# Patient Record
Sex: Male | Born: 1995 | Race: White | Hispanic: No | Marital: Single | Smoking: Never smoker
Health system: Southern US, Community
[De-identification: ages and names within clinical notes are randomized; demographics above are authoritative.]

---
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
## 2019-08-24 ENCOUNTER — Other Ambulatory Visit: Payer: Self-pay

## 2019-08-24 ENCOUNTER — Ambulatory Visit: Payer: Commercial Managed Care - HMO | Attending: Family

## 2019-08-24 DIAGNOSIS — Z20822 Contact with and (suspected) exposure to covid-19: Secondary | ICD-10-CM

## 2019-08-25 LAB — NOVEL CORONAVIRUS, NAA: SARS-CoV-2, NAA: NOT DETECTED

## 2019-12-17 ENCOUNTER — Emergency Department: Payer: 59

## 2019-12-17 ENCOUNTER — Emergency Department
Admission: EM | Admit: 2019-12-17 | Discharge: 2019-12-17 | Disposition: A | Discharge status: Home / Self Care | Payer: 59 | Attending: Emergency Medicine | Admitting: Emergency Medicine

## 2019-12-17 ENCOUNTER — Encounter: Payer: Self-pay | Admitting: Emergency Medicine

## 2019-12-17 ENCOUNTER — Other Ambulatory Visit: Payer: Self-pay

## 2019-12-17 DIAGNOSIS — W228XXA Striking against or struck by other objects, initial encounter: Secondary | ICD-10-CM | POA: Diagnosis not present

## 2019-12-17 DIAGNOSIS — M25572 Pain in left ankle and joints of left foot: Secondary | ICD-10-CM | POA: Insufficient documentation

## 2019-12-17 DIAGNOSIS — Y999 Unspecified external cause status: Secondary | ICD-10-CM | POA: Diagnosis not present

## 2019-12-17 DIAGNOSIS — M79672 Pain in left foot: Secondary | ICD-10-CM

## 2019-12-17 DIAGNOSIS — Y929 Unspecified place or not applicable: Secondary | ICD-10-CM | POA: Diagnosis not present

## 2019-12-17 DIAGNOSIS — Y9389 Activity, other specified: Secondary | ICD-10-CM | POA: Diagnosis not present
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
## 2021-11-28 ENCOUNTER — Ambulatory Visit: Payer: Self-pay

## 2021-11-28 NOTE — Telephone Encounter (Signed)
The patient's mother has called to share that the patient recently had two tick bites  ? ?The area of the bites has remains red and irritated  ? ?For the past week the patient has had a lack of appetite and inability to sleep  ? ?The patient's mother has not noticed a fever or lack of balance  ? ?Please contact further when possible  ? ? ? ?Chief Complaint: Tick bite ?Symptoms: Lethargy, insomnia ?Frequency: 1 month ago ?Pertinent Negatives: Patient denies rash ?Disposition: [] ED /[x] Urgent Care (no appt availability in office) / [] Appointment(In office/virtual)/ []  [place] [facility]/ [] Home Care/ [] Refused Recommended Disposition /[] [place] [facility]/ []  Follow-up with PCP ?Additional Notes: Declines UC. Wants to establish at the practice and be seen for this. Mother is a patient of Dr. . Please advise.  ?Reason for Disposition ? [1] Red or very tender (to touch) area AND [2] started over 24 hours after the bite ? ?Answer Assessment - Initial Assessment Questions ?1. TYPE of TICK: "Is it a wood tick or a deer tick?" (e.g., deer tick, wood tick; unsure) ?    Unsure ?2. SIZE of TICK: "How big is the tick?" (e.g., size of poppy seed, apple seed, watermelon seed; unsure) Note: Deer ticks can be the size of a poppy seed (nymph) or an apple seed (adult).   ?    Small ?3. ENGORGED: "Did the tick look flat or engorged (full, swollen)?" (e.g., flat, engorged; unsure) ?    No ?4. LOCATION: "Where is the tick bite located?"  ?    Side and behind his knee ?5. ONSET: "How long do you think the tick was attached before you removed it?" (e.g., 5 hours, 2 days)  ?    1 month ago ?6. APPEARANCE of BITE or RASH: "What does the site look like?" ?    Red ?7. PREGNANCY: "Is there any chance you are pregnant?" "When was your last menstrual period?" ?    N/a ? ?Protocols used: Tick Bite-A-AH ? ?

## 2021-11-28 NOTE — Telephone Encounter (Signed)
Please advise
